# Patient Record
Sex: Female | Born: 1965 | Race: White | Hispanic: No | Marital: Married | Smoking: Never smoker
Health system: Southern US, Community
[De-identification: ages and names within clinical notes are randomized; demographics above are authoritative.]

## PROBLEM LIST (undated history)

## (undated) DIAGNOSIS — K5792 Diverticulitis of intestine, part unspecified, without perforation or abscess without bleeding: Secondary | ICD-10-CM

## (undated) DIAGNOSIS — M1990 Unspecified osteoarthritis, unspecified site: Secondary | ICD-10-CM

## (undated) HISTORY — PX: TUBAL LIGATION: SHX77

## (undated) HISTORY — PX: ABLATION: SHX5711

## (undated) HISTORY — PX: NASAL SINUS SURGERY: SHX719

## (undated) HISTORY — PX: SIGMOIDECTOMY: SHX176

---

## 2007-12-28 ENCOUNTER — Emergency Department (HOSPITAL_BASED_OUTPATIENT_CLINIC_OR_DEPARTMENT_OTHER): Admission: EM | Admit: 2007-12-28 | Discharge: 2007-12-28 | Payer: Self-pay | Admitting: Emergency Medicine

## 2019-08-29 ENCOUNTER — Other Ambulatory Visit: Payer: Self-pay

## 2019-08-29 ENCOUNTER — Emergency Department (HOSPITAL_BASED_OUTPATIENT_CLINIC_OR_DEPARTMENT_OTHER): Payer: Managed Care, Other (non HMO)

## 2019-08-29 ENCOUNTER — Emergency Department (HOSPITAL_BASED_OUTPATIENT_CLINIC_OR_DEPARTMENT_OTHER)
Admission: EM | Admit: 2019-08-29 | Discharge: 2019-08-29 | Disposition: A | Discharge status: Home / Self Care | Payer: Managed Care, Other (non HMO) | Attending: Emergency Medicine | Admitting: Emergency Medicine

## 2019-08-29 ENCOUNTER — Encounter (HOSPITAL_BASED_OUTPATIENT_CLINIC_OR_DEPARTMENT_OTHER): Payer: Self-pay | Admitting: *Deleted

## 2019-08-29 DIAGNOSIS — K5732 Diverticulitis of large intestine without perforation or abscess without bleeding: Secondary | ICD-10-CM | POA: Diagnosis not present

## 2019-08-29 DIAGNOSIS — R109 Unspecified abdominal pain: Secondary | ICD-10-CM | POA: Diagnosis present

## 2019-08-29 DIAGNOSIS — Z79899 Other long term (current) drug therapy: Secondary | ICD-10-CM | POA: Diagnosis not present

## 2019-08-29 DIAGNOSIS — K5792 Diverticulitis of intestine, part unspecified, without perforation or abscess without bleeding: Secondary | ICD-10-CM

## 2019-08-29 HISTORY — DX: Diverticulitis of intestine, part unspecified, without perforation or abscess without bleeding: K57.92

## 2019-08-29 HISTORY — DX: Unspecified osteoarthritis, unspecified site: M19.90

## 2019-08-29 LAB — URINALYSIS, ROUTINE W REFLEX MICROSCOPIC
Bilirubin Urine: NEGATIVE
Glucose, UA: NEGATIVE mg/dL
Hgb urine dipstick: NEGATIVE
Ketones, ur: NEGATIVE mg/dL
Leukocytes,Ua: NEGATIVE
Nitrite: NEGATIVE
Protein, ur: NEGATIVE mg/dL
Specific Gravity, Urine: 1.025 (ref 1.005–1.030)
pH: 5.5 (ref 5.0–8.0)

## 2019-08-29 LAB — CBC
HCT: 39.3 % (ref 36.0–46.0)
Hemoglobin: 13.2 g/dL (ref 12.0–15.0)
MCH: 29.2 pg (ref 26.0–34.0)
MCHC: 33.6 g/dL (ref 30.0–36.0)
MCV: 86.9 fL (ref 80.0–100.0)
Platelets: 250 10*3/uL (ref 150–400)
RBC: 4.52 MIL/uL (ref 3.87–5.11)
RDW: 12.4 % (ref 11.5–15.5)
WBC: 11.3 10*3/uL — ABNORMAL HIGH (ref 4.0–10.5)
nRBC: 0 % (ref 0.0–0.2)

## 2019-08-29 LAB — COMPREHENSIVE METABOLIC PANEL
ALT: 16 U/L (ref 0–44)
AST: 17 U/L (ref 15–41)
Albumin: 3.9 g/dL (ref 3.5–5.0)
Alkaline Phosphatase: 97 U/L (ref 38–126)
Anion gap: 11 (ref 5–15)
BUN: 13 mg/dL (ref 6–20)
CO2: 22 mmol/L (ref 22–32)
Calcium: 8.9 mg/dL (ref 8.9–10.3)
Chloride: 102 mmol/L (ref 98–111)
Creatinine, Ser: 0.57 mg/dL (ref 0.44–1.00)
GFR calc Af Amer: >60 mL/min (ref >60)
GFR calc non Af Amer: >60 mL/min (ref >60)
Glucose, Bld: 104 mg/dL — ABNORMAL HIGH (ref 70–99)
Potassium: 3.3 mmol/L — ABNORMAL LOW (ref 3.5–5.1)
Sodium: 135 mmol/L (ref 135–145)
Total Bilirubin: 0.6 mg/dL (ref 0.3–1.2)
Total Protein: 7.4 g/dL (ref 6.5–8.1)

## 2019-08-29 LAB — PREGNANCY, URINE: Preg Test, Ur: NEGATIVE

## 2019-08-29 LAB — LIPASE, BLOOD: Lipase: 21 U/L (ref 11–51)

## 2019-08-29 MED ORDER — AMOXICILLIN-POT CLAVULANATE 875-125 MG PO TABS: 1.0000 | ORAL_TABLET | Freq: Two times a day (BID) | ORAL | 0 refills | Status: AC

## 2019-08-29 MED ORDER — ONDANSETRON HCL 4 MG/2ML IJ SOLN
4.0000 mg | Freq: Once | INTRAMUSCULAR | Status: AC
  Administered 2019-08-29: 4 mg via INTRAVENOUS
  Filled 2019-08-29: qty 2

## 2019-08-29 MED ORDER — AMOXICILLIN-POT CLAVULANATE 875-125 MG PO TABS
1.0000 | ORAL_TABLET | Freq: Once | ORAL | Status: AC
  Administered 2019-08-29: 1 via ORAL
  Filled 2019-08-29: qty 1

## 2019-08-29 MED ORDER — HYDROCODONE-ACETAMINOPHEN 5-325 MG PO TABS: 2.0000 | ORAL_TABLET | ORAL | 0 refills | Status: AC | PRN

## 2019-08-29 MED ORDER — SODIUM CHLORIDE 0.9% FLUSH
3.0000 mL | Freq: Once | INTRAVENOUS | Status: DC
  Filled 2019-08-29: qty 3

## 2019-08-29 MED ORDER — SODIUM CHLORIDE 0.9 % IV BOLUS
1000.0000 mL | Freq: Once | INTRAVENOUS | Status: AC
  Administered 2019-08-29: 1000 mL via INTRAVENOUS

## 2019-08-29 MED ORDER — MORPHINE SULFATE (PF) 4 MG/ML IV SOLN
4.0000 mg | Freq: Once | INTRAVENOUS | Status: AC
  Administered 2019-08-29: 4 mg via INTRAVENOUS
  Filled 2019-08-29: qty 1

## 2019-08-29 NOTE — Discharge Instructions (Addendum)
Take Augmentin as prescribed and complete the full course unless instructed otherwise by your provider. Take Norco as needed as prescribed. Return to ER for fevers, uncontrolled pain, vomiting, worsening or concerning symptoms. Follow-up with your provider in the next 1 to 2 days.

## 2019-08-29 NOTE — ED Triage Notes (Signed)
Abdominal pain x 2 days. Hx of diverticulitis.  

## 2019-08-29 NOTE — ED Notes (Signed)
Attempted to place administer IV stick to Pt. X 2 with unsuccessful attempt.

## 2019-08-29 NOTE — ED Notes (Signed)
Pt. Reports she had some of her colon removed recently due to diverticulitis

## 2019-08-29 NOTE — ED Provider Notes (Signed)
[facility] EMERGENCY DEPARTMENT Provider Note   CSN: [id number] Arrival date & time: 08/29/19  1633     History Chief Complaint  Patient presents with  . Abdominal Pain    Emma Rodriguez is a 54 y.o. female.  54 year old female presents with complaint of abdominal pain with nausea, fever, diarrhea.  Patient states symptoms started 2 days ago, progressively worsening.  Patient had a temp of 100.3 earlier today, had not taken anything for her fever which improved upon arrival in the emergency room.  Patient was seen by her providers office and was told to come to the ER for CT scan.  Patient is 3 months post sigmoid colectomy for diverticulitis.  Pain is described as being kicked in the stomach, more so in the right lower quadrant, nothing makes pain better or worse.  No known sick contacts, no blood in stools, no vomiting.  No other complaints or concerns.        Past Medical History:  Diagnosis Date  . Arthritis   . Diverticulitis     There are no problems to display for this patient.   Past Surgical History:  Procedure Laterality Date  . ABLATION    . NASAL SINUS SURGERY    . SIGMOIDECTOMY    . TUBAL LIGATION       OB History   No obstetric history on file.     No family history on file.  Social History   Tobacco Use  . Smoking status: Never Smoker  . Smokeless tobacco: Never Used  Substance Use Topics  . Alcohol use: Never  . Drug use: Never    Home Medications Prior to Admission medications   Medication Sig Start Date End Date Taking? Authorizing Provider  Cholecalciferol 125 MCG (5000 UT) TABS Take by mouth.   Yes [provider]  dicyclomine (BENTYL) 20 MG tablet Take by mouth. 10/07/18  Yes [provider]  diphenhydrAMINE (SOMINEX) 25 MG tablet Take by mouth.   Yes [provider]  fluticasone (FLONASE) 50 MCG/ACT nasal spray Place into the nose.   Yes [provider]  inFLIXimab (REMICADE) 100 MG  injection Inject into the vein.   Yes [provider]  leucovorin (WELLCOVORIN) 5 MG tablet Take by mouth. 03/27/17  Yes [provider]  lisinopril (ZESTRIL) 20 MG tablet Take by mouth. 04/25/19  Yes [provider]  methotrexate (RHEUMATREX) 2.5 MG tablet Take by mouth.   Yes [provider]  omeprazole (PRILOSEC) 20 MG capsule Take by mouth.   Yes [provider]  ondansetron (ZOFRAN) 4 MG tablet Take by mouth. 11/15/17  Yes [provider]  pravastatin (PRAVACHOL) 20 MG tablet Take 1 tablet by mouth daily. 10/07/18  Yes [provider]  Probiotic CAPS Take by mouth.   Yes [provider]  sertraline (ZOLOFT) 100 MG tablet Take by mouth. 06/27/19  Yes [provider]  traZODone (DESYREL) 50 MG tablet TAKE ONE TABLET BY MOUTH ONCE NIGHTLY .  Patient must have office visit before next refill. 08/21/19  Yes [provider]  triamterene-hydrochlorothiazide (DYAZIDE) 37.5-25 MG capsule Take 1 capsule by mouth daily. 09/14/18  Yes [provider]  amoxicillin-clavulanate (AUGMENTIN) 875-125 MG tablet Take 1 tablet by mouth every 12 (twelve) hours. 08/29/19   Jeannie Fend, PA-C  HYDROcodone-acetaminophen (NORCO/VICODIN) 5-325 MG tablet Take 2 tablets by mouth every 4 (four) hours as needed. 08/29/19   Jeannie Fend, PA-C    Allergies    Patient  has no known allergies.  Review of Systems   Review of Systems  Constitutional: Positive for fever. Negative for diaphoresis.  Respiratory: Negative for shortness of breath.   Cardiovascular: Negative for chest pain.  Gastrointestinal: Positive for abdominal pain, diarrhea and nausea. Negative for vomiting.  Genitourinary: Negative for dysuria and frequency.  Musculoskeletal: Negative for back pain.  Skin: Negative for rash and wound.  Allergic/Immunologic: Negative for immunocompromised state.  Neurological: Negative for weakness.  Hematological: Negative  for adenopathy.  Psychiatric/Behavioral: Negative for confusion.  All other systems reviewed and are negative.   Physical Exam Updated Vital Signs BP 109/74   Pulse 88   Temp 98.8 F (37.1 C) (Oral)   Resp 18   Ht 5' (1.524 m)   Wt 77.1 kg   SpO2 97%   BMI 33.20 kg/m   Physical Exam Vitals and nursing note reviewed.  Constitutional:      General: She is not in acute distress.    Appearance: She is well-developed. She is not diaphoretic.  HENT:     Head: Normocephalic and atraumatic.  Cardiovascular:     Rate and Rhythm: Normal rate and regular rhythm.     Heart sounds: Normal heart sounds. No murmur heard.   Pulmonary:     Effort: Pulmonary effort is normal.     Breath sounds: Normal breath sounds.  Abdominal:     General: A surgical scar is present.     Tenderness: There is abdominal tenderness in the right lower quadrant. There is no right CVA tenderness or left CVA tenderness.  Skin:    General: Skin is warm and dry.     Findings: No erythema or rash.  Neurological:     Mental Status: She is alert and oriented to person, place, and time.  Psychiatric:        Behavior: Behavior normal.     ED Results / Procedures / Treatments   Labs (all labs ordered are listed, but only abnormal results are displayed) Labs Reviewed  COMPREHENSIVE METABOLIC PANEL - Abnormal; Notable for the following components:      Result Value   Potassium 3.3 (*)    Glucose, Bld 104 (*)    All other components within normal limits  CBC - Abnormal; Notable for the following components:   WBC 11.3 (*)    All other components within normal limits  LIPASE, BLOOD  URINALYSIS, ROUTINE W REFLEX MICROSCOPIC  PREGNANCY, URINE    EKG None  Radiology CT ABDOMEN PELVIS WO CONTRAST  Result Date: 08/29/2019 CLINICAL DATA:  Abdominal pain with fever post colectomy EXAM: CT ABDOMEN AND PELVIS WITHOUT CONTRAST TECHNIQUE: Multidetector CT imaging of the abdomen and pelvis was performed following  the standard protocol without IV contrast. COMPARISON:  November 02, 2018 FINDINGS: Lower chest: The visualized heart size within normal limits. No pericardial fluid/thickening. A small hiatal hernia present. The visualized portions of the lungs are clear. Hepatobiliary: Although limited due to the lack of intravenous contrast, normal in appearance without gross focal abnormality. No evidence of calcified gallstones or biliary ductal dilatation. Pancreas:  Unremarkable.  No surrounding inflammatory changes. Spleen: Normal in size. Although limited due to the lack of intravenous contrast, normal in appearance. Adrenals/Urinary Tract: Both adrenal glands appear normal. Again noted are bilateral parapelvic cysts. No renal or collecting system calculi are seen. No hydronephrosis is noted. Bladder is unremarkable. Stomach/Bowel: The stomach and small bowel are normal in appearance. There is been a prior sigmoid rectal junction anastomosis. Scattered colonic  diverticular with significant wall thickening and surrounding fat stranding changes are seen at the distal sigmoid colon near the anastomosis. No free air or loculated fluid collection are noted. Vascular/Lymphatic: There are no enlarged abdominal or pelvic lymph nodes. No significant gross vascular findings are present. Reproductive: The uterus and adnexa are unremarkable. Other: No evidence of abdominal wall mass or hernia. Musculoskeletal: No acute or significant osseous findings. IMPRESSION: Prior partial colectomy with findings of acute diverticulitis of the distal descending sigmoid colon near the anastomosis. No loculated fluid collections or free air. Electronically Signed   By: Jonna Clark M.D.   On: 08/29/2019 19:36    Procedures Procedures (including critical care time)  Medications Ordered in ED Medications  sodium chloride flush (NS) 0.9 % injection 3 mL (3 mLs Intravenous Not Given 08/29/19 1907)  amoxicillin-clavulanate (AUGMENTIN) 875-125 MG  per tablet 1 tablet (has no administration in time range)  sodium chloride 0.9 % bolus 1,000 mL (1,000 mLs Intravenous New Bag/Given 08/29/19 1908)  ondansetron (ZOFRAN) injection 4 mg (4 mg Intravenous Given 08/29/19 1908)  morphine 4 MG/ML injection 4 mg (4 mg Intravenous Given 08/29/19 1909)    ED Course  I have reviewed the triage vital signs and the nursing notes.  Pertinent labs & imaging results that were available during my care of the patient were reviewed by me and considered in my medical decision making (see chart for details).  Clinical Course as of Aug 28 2005  Tue Aug 29, 2019  697 54 year old female with diverticulitis with recent colectomy presents with abdominal pain with nausea and concern for recurrent diverticulitis.  On exam patient has tenderness in the right lower quadrant.  Patient is given IV fluids and morphine, symptoms have improved.  Review of lab work, mild leukocytosis with white count of 11.3, lipase within normal limits, CMP with mild hypokalemia of 3.3.  Urinalysis is unremarkable and urine pregnancy test is negative.  CT shows diverticulitis without further complications.  Reviewed results with patient and her spouse at bedside.  Patient states in the past she has always been treated with Cipro and Flagyl, has never tried Augmentin, has never been told of any other treatment options.  Discussed concerns for Cipro use, will start with Augmentin however with patient's complex diverticulitis history, recommend that she contact her providers office tomorrow to discuss results and see if they would like to change her antibiotics.  Patient is also given Norco for her pain.  Patient return to ER for worsening symptoms, persistent fever, other complaints or concerns otherwise plan to follow-up with PCP or other provider in her care team.   [LM]    Clinical Course User Index [LM] Alden Hipp   MDM Rules/Calculators/A&P                          Final Clinical  Impression(s) / ED Diagnoses Final diagnoses:  Diverticulitis    Rx / DC Orders ED Discharge Orders         Ordered    amoxicillin-clavulanate (AUGMENTIN) 875-125 MG tablet  Every 12 hours     Discontinue  Reprint     08/29/19 1959    HYDROcodone-acetaminophen (NORCO/VICODIN) 5-325 MG tablet  Every 4 hours PRN     Discontinue  Reprint     08/29/19 1959           Jeannie Fend, PA-C 08/29/19 2007    Geoffery Lyons, MD 08/30/19 1545

## 2021-09-23 IMAGING — CT CT ABD-PELV W/O CM
2 of 4 series · 16 of 46 positions shown, 18 images · non-contrast
Comparison: November 02, 2018

CLINICAL DATA: Abdominal pain with fever post colectomy

EXAM:
CT ABDOMEN AND PELVIS WITHOUT CONTRAST
TECHNIQUE: Multidetector CT imaging of the abdomen and pelvis was performed
following the standard protocol without IV contrast.

[Series 2: axial st · axial · 0.71mm/px · z∈[-578,-184]mm · 13 of 87 slices shown, 15 images]
[im 4/87  soft-tissue]
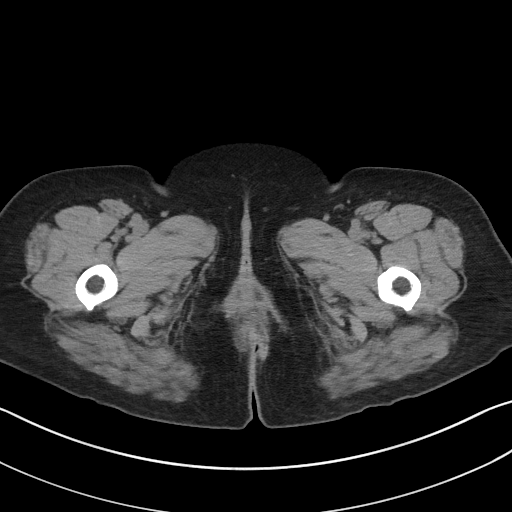
[im 4/87  bone]
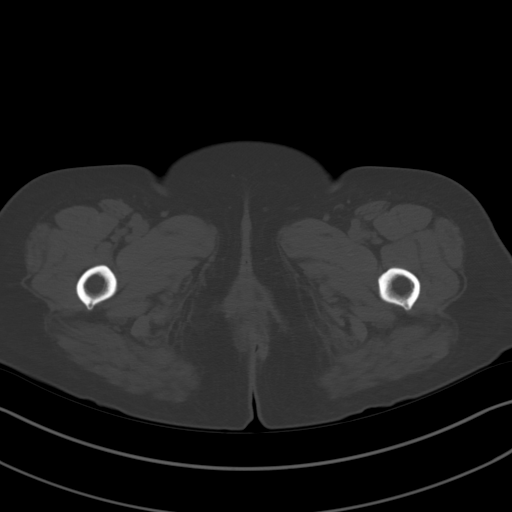
[im 11/87  soft-tissue]
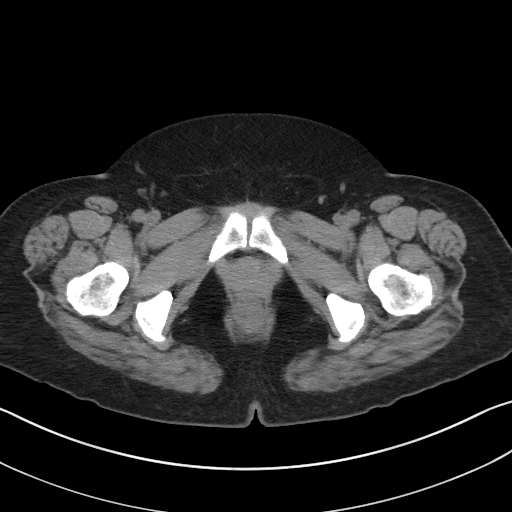
[im 18/87  soft-tissue]
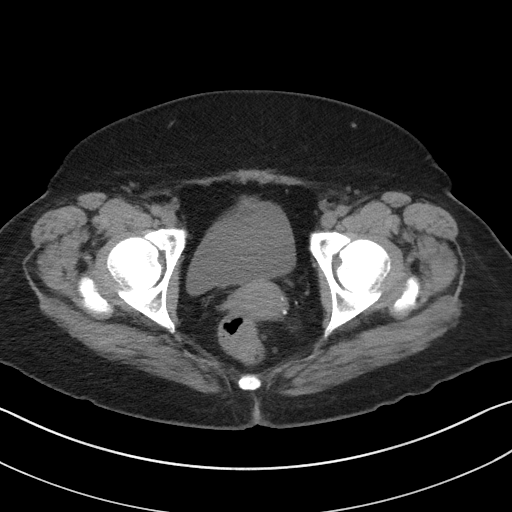
[im 25/87  soft-tissue]
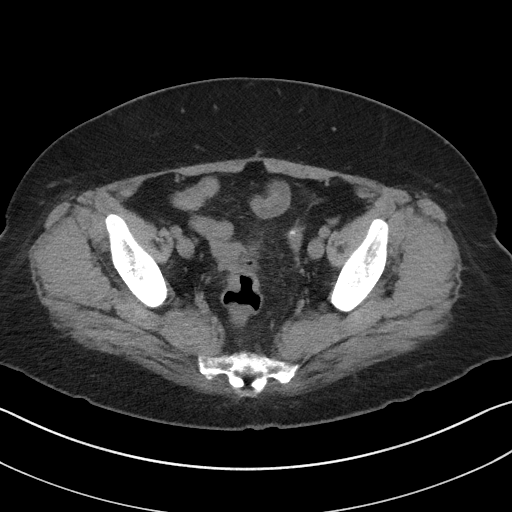
[im 31/87  soft-tissue]
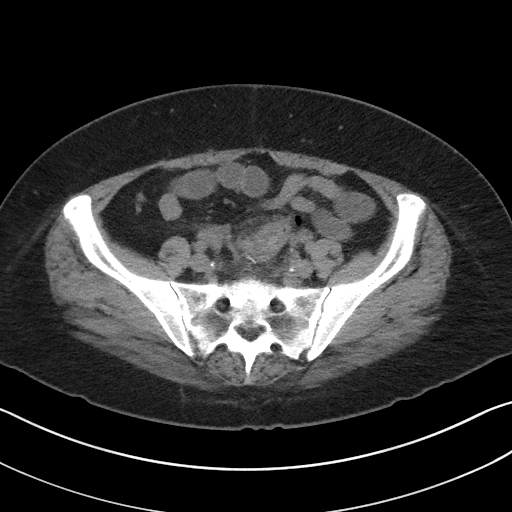
[im 38/87  soft-tissue]
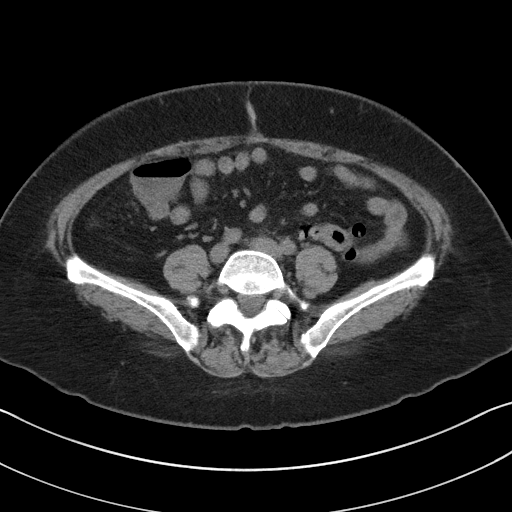
[im 45/87  soft-tissue]
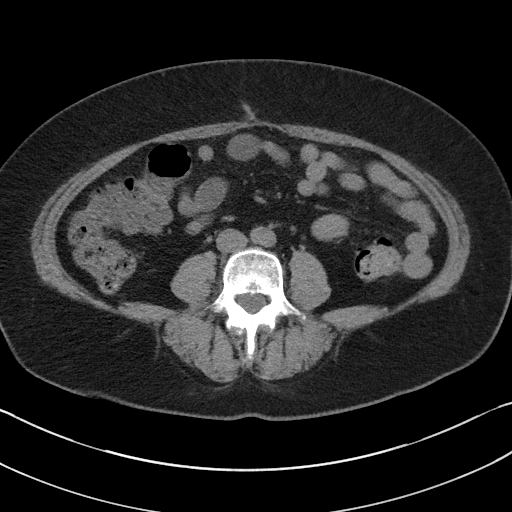
[im 49/87  soft-tissue]
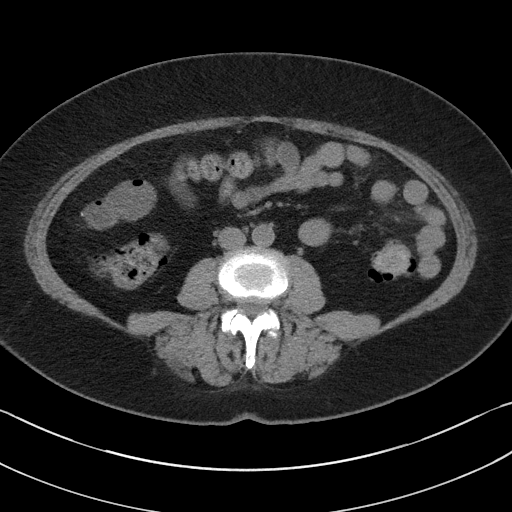
[im 56/87  soft-tissue]
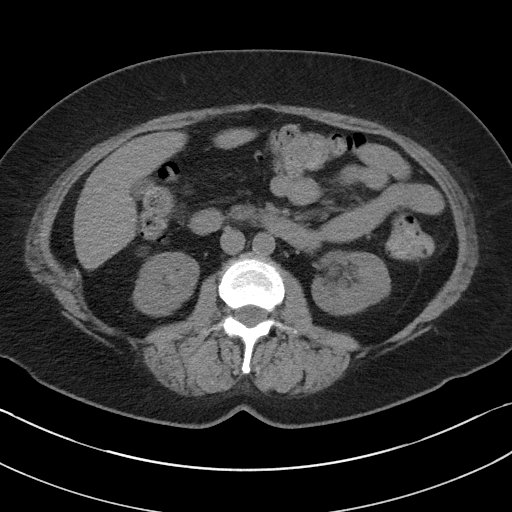
[im 56/87  bone]
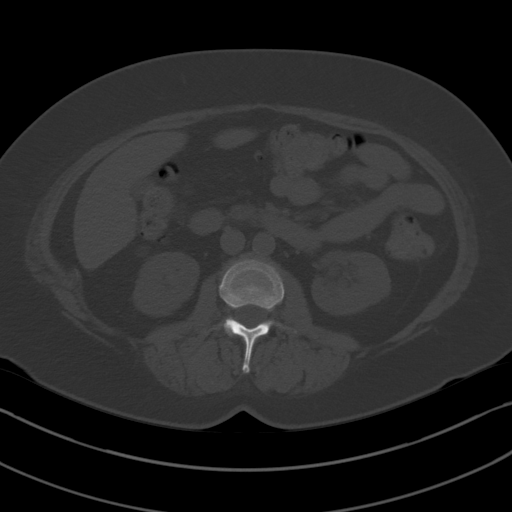
[im 62/87  soft-tissue]
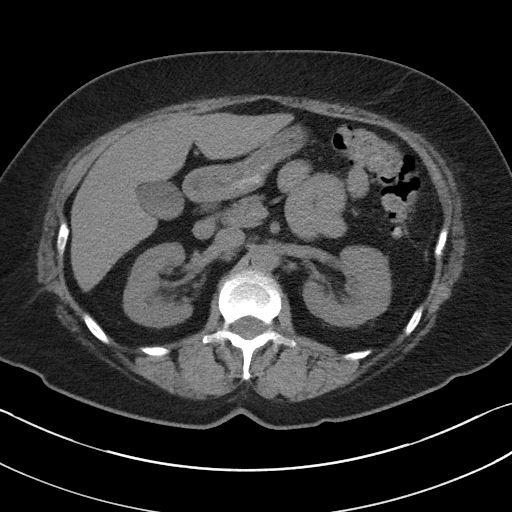
[im 69/87  soft-tissue]
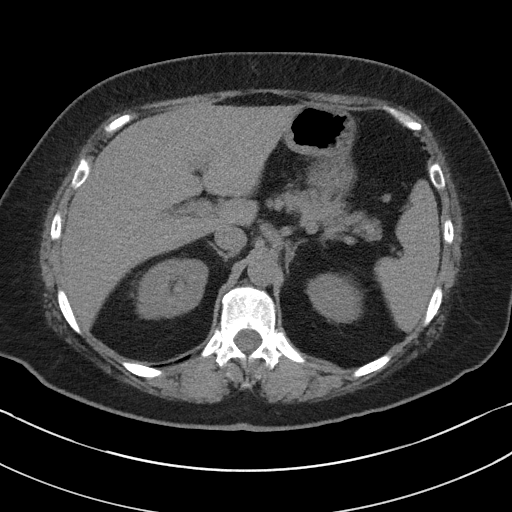
[im 76/87  soft-tissue]
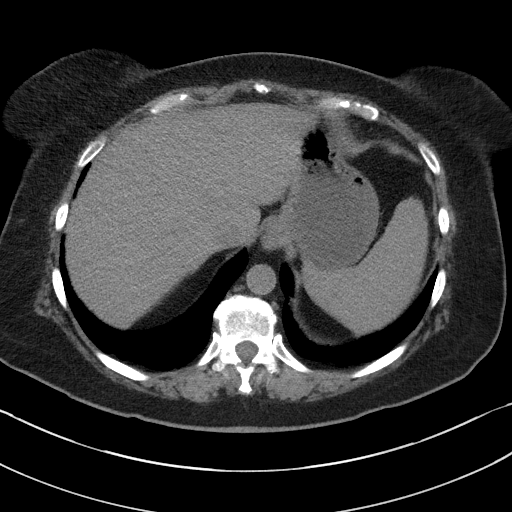
[im 83/87  soft-tissue]
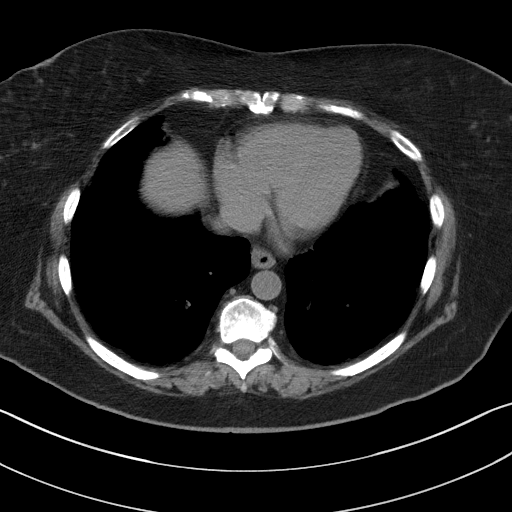

[Series 5: coronal st · coronal · 0.70mm/px · 3 of 101 slices shown]
[im 34/101  soft-tissue]
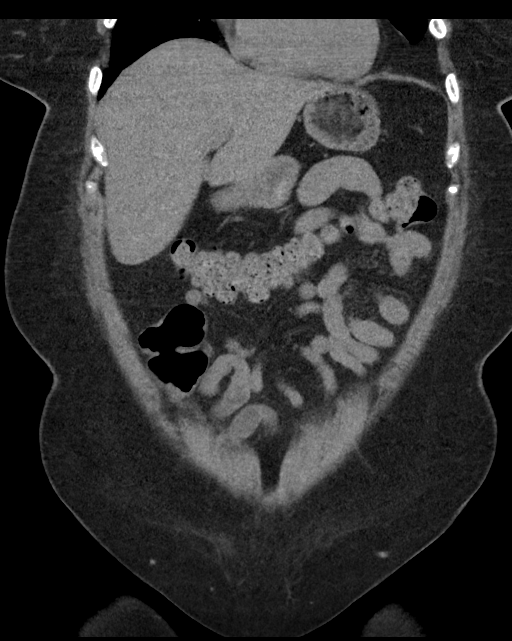
[im 45/101  soft-tissue]
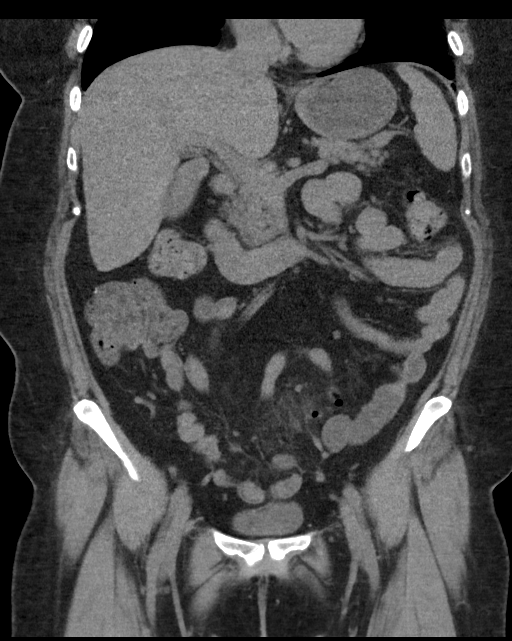
[im 56/101  soft-tissue]
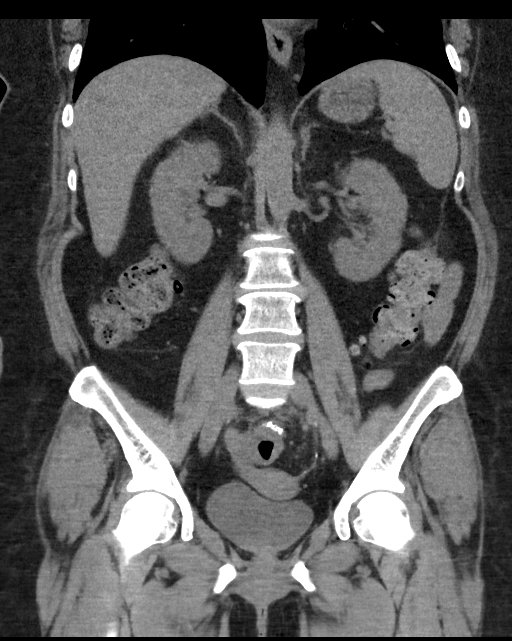

[16 of 46 positions shown; findings below may reference images not displayed]

FINDINGS: Lower chest: The visualized heart size within normal limits. No
pericardial fluid/thickening.

A small hiatal hernia present.

The visualized portions of the lungs are clear.

Hepatobiliary: Although limited due to the lack of intravenous
contrast, normal in appearance without gross focal abnormality. No
evidence of calcified gallstones or biliary ductal dilatation.

Pancreas:  Unremarkable.  No surrounding inflammatory changes.

Spleen: Normal in size. Although limited due to the lack of
intravenous contrast, normal in appearance.

Adrenals/Urinary Tract: Both adrenal glands appear normal. Again
noted are bilateral parapelvic cysts. No renal or collecting system
calculi are seen. No hydronephrosis is noted. Bladder is
unremarkable.

Stomach/Bowel: The stomach and small bowel are normal in appearance.
There is been a prior sigmoid rectal junction anastomosis. Scattered
colonic diverticular with significant wall thickening and
surrounding fat stranding changes are seen at the distal sigmoid
colon near the anastomosis. No free air or loculated fluid
collection are noted.

Vascular/Lymphatic: There are no enlarged abdominal or pelvic lymph
nodes. No significant gross vascular findings are present.

Reproductive: The uterus and adnexa are unremarkable.

Other: No evidence of abdominal wall mass or hernia.

Musculoskeletal: No acute or significant osseous findings.
IMPRESSION: Prior partial colectomy with findings of acute diverticulitis of the
distal descending sigmoid colon near the anastomosis. No loculated
fluid collections or free air.

## 2023-01-13 ENCOUNTER — Other Ambulatory Visit: Payer: Self-pay

## 2023-01-13 ENCOUNTER — Encounter (HOSPITAL_BASED_OUTPATIENT_CLINIC_OR_DEPARTMENT_OTHER): Payer: Self-pay | Admitting: Urology

## 2023-01-13 ENCOUNTER — Emergency Department (HOSPITAL_BASED_OUTPATIENT_CLINIC_OR_DEPARTMENT_OTHER)
Admission: EM | Admit: 2023-01-13 | Discharge: 2023-01-13 | Disposition: A | Discharge status: Home / Self Care | Payer: BC Managed Care – PPO | Attending: Emergency Medicine | Admitting: Emergency Medicine

## 2023-01-13 ENCOUNTER — Emergency Department (HOSPITAL_BASED_OUTPATIENT_CLINIC_OR_DEPARTMENT_OTHER): Payer: BC Managed Care – PPO

## 2023-01-13 DIAGNOSIS — E876 Hypokalemia: Secondary | ICD-10-CM | POA: Insufficient documentation

## 2023-01-13 DIAGNOSIS — R002 Palpitations: Secondary | ICD-10-CM | POA: Insufficient documentation

## 2023-01-13 LAB — MAGNESIUM: Magnesium: 2.1 mg/dL (ref 1.7–2.4)

## 2023-01-13 LAB — BASIC METABOLIC PANEL
Anion gap: 10 (ref 5–15)
BUN: 22 mg/dL — ABNORMAL HIGH (ref 6–20)
CO2: 22 mmol/L (ref 22–32)
Calcium: 9.2 mg/dL (ref 8.9–10.3)
Chloride: 105 mmol/L (ref 98–111)
Creatinine, Ser: 0.74 mg/dL (ref 0.44–1.00)
GFR, Estimated: >60 mL/min (ref >60)
Glucose, Bld: 126 mg/dL — ABNORMAL HIGH (ref 70–99)
Potassium: 2.9 mmol/L — ABNORMAL LOW (ref 3.5–5.1)
Sodium: 137 mmol/L (ref 135–145)

## 2023-01-13 LAB — TROPONIN I (HIGH SENSITIVITY)
Troponin I (High Sensitivity): <2 ng/L (ref <18)
Troponin I (High Sensitivity): <2 ng/L (ref <18)

## 2023-01-13 LAB — CBC
HCT: 41.2 % (ref 36.0–46.0)
Hemoglobin: 14.1 g/dL (ref 12.0–15.0)
MCH: 29.5 pg (ref 26.0–34.0)
MCHC: 34.2 g/dL (ref 30.0–36.0)
MCV: 86.2 fL (ref 80.0–100.0)
Platelets: 299 10*3/uL (ref 150–400)
RBC: 4.78 MIL/uL (ref 3.87–5.11)
RDW: 13.4 % (ref 11.5–15.5)
WBC: 10.1 10*3/uL (ref 4.0–10.5)
nRBC: 0 % (ref 0.0–0.2)

## 2023-01-13 LAB — PREGNANCY, URINE: Preg Test, Ur: NEGATIVE

## 2023-01-13 MED ORDER — POTASSIUM CHLORIDE CRYS ER 20 MEQ PO TBCR
40.0000 meq | EXTENDED_RELEASE_TABLET | Freq: Once | ORAL | Status: AC
  Administered 2023-01-13: 40 meq via ORAL
  Filled 2023-01-13: qty 2

## 2023-01-13 NOTE — ED Provider Notes (Signed)
[facility] [facility] AT [facility] Provider Note   CSN: [id number] Arrival date & time: 01/13/23  1659     History  Chief Complaint  Patient presents with   Palpitations    Emma Rodriguez is a 57 y.o. female with no pertinent past medical history presented for palpitations.  Patient is recently getting over pneumonia and has not been eating much and went to the urgent care but was brought here as she was told that she was having palpitations and needed to be seen here.  Patient denies any cardiac history, shortness of breath, fevers, thyroid history.  Patient denies any heart abnormalities or pacemakers.  Home Medications Prior to Admission medications   Medication Sig Start Date End Date Taking? Authorizing Provider  amoxicillin-clavulanate (AUGMENTIN) 875-125 MG tablet Take 1 tablet by mouth every 12 (twelve) hours. 08/29/19   Jeannie Fend, PA-C  Cholecalciferol 125 MCG (5000 UT) TABS Take by mouth.    [provider]  dicyclomine (BENTYL) 20 MG tablet Take by mouth. 10/07/18   [provider]  diphenhydrAMINE (SOMINEX) 25 MG tablet Take by mouth.    [provider]  fluticasone (FLONASE) 50 MCG/ACT nasal spray Place into the nose.    [provider]  HYDROcodone-acetaminophen (NORCO/VICODIN) 5-325 MG tablet Take 2 tablets by mouth every 4 (four) hours as needed. 08/29/19   Jeannie Fend, PA-C  inFLIXimab (REMICADE) 100 MG injection Inject into the vein.    [provider]  leucovorin (WELLCOVORIN) 5 MG tablet Take by mouth. 03/27/17   [provider]  lisinopril (ZESTRIL) 20 MG tablet Take by mouth. 04/25/19   [provider]  methotrexate (RHEUMATREX) 2.5 MG tablet Take by mouth.    [provider]  omeprazole (PRILOSEC) 20 MG capsule Take by mouth.    [provider]  ondansetron (ZOFRAN) 4 MG tablet Take by mouth. 11/15/17   [provider]  pravastatin (PRAVACHOL) 20  MG tablet Take 1 tablet by mouth daily. 10/07/18   [provider]  Probiotic CAPS Take by mouth.    [provider]  sertraline (ZOLOFT) 100 MG tablet Take by mouth. 06/27/19   [provider]  traZODone (DESYREL) 50 MG tablet TAKE ONE TABLET BY MOUTH ONCE NIGHTLY .  Patient must have office visit before next refill. 08/21/19   [provider]  triamterene-hydrochlorothiazide (DYAZIDE) 37.5-25 MG capsule Take 1 capsule by mouth daily. 09/14/18   [provider]      Allergies    Codeine and Tetanus toxoids    Review of Systems   Review of Systems  Cardiovascular:  Positive for palpitations.    Physical Exam Updated Vital Signs BP 139/88   Pulse 81   Temp (!) 97.4 F (36.3 C) (Oral)   Resp 16   Ht 5' (1.524 m)   Wt 77.1 kg   SpO2 96%   BMI 33.20 kg/m  Physical Exam Vitals reviewed.  Constitutional:      General: She is not in acute distress. HENT:     Head: Normocephalic and atraumatic.  Eyes:     Extraocular Movements: Extraocular movements intact.     Conjunctiva/sclera: Conjunctivae normal.     Pupils: Pupils are equal, round, and reactive to light.  Neck:     Comments: No goiter Cardiovascular:     Rate and Rhythm: Normal rate and regular rhythm.     Pulses: Normal pulses.     Heart sounds: Normal heart sounds.  Comments: 2+ bilateral radial/dorsalis pedis pulses with regular rate Pulmonary:     Effort: Pulmonary effort is normal. No respiratory distress.     Breath sounds: Normal breath sounds.  Abdominal:     Palpations: Abdomen is soft.     Tenderness: There is no abdominal tenderness. There is no guarding or rebound.  Musculoskeletal:        General: Normal range of motion.     Cervical back: Normal range of motion and neck supple.     Comments: 5 out of 5 bilateral grip/leg extension strength  Skin:    General: Skin is warm and dry.     Capillary Refill: Capillary refill takes less than 2 seconds.   Neurological:     General: No focal deficit present.     Mental Status: She is alert and oriented to person, place, and time.     Comments: Sensation intact in all 4 limbs  Psychiatric:        Mood and Affect: Mood normal.     ED Results / Procedures / Treatments   Labs (all labs ordered are listed, but only abnormal results are displayed) Labs Reviewed  BASIC METABOLIC PANEL - Abnormal; Notable for the following components:      Result Value   Potassium 2.9 (*)    Glucose, Bld 126 (*)    BUN 22 (*)    All other components within normal limits  CBC  PREGNANCY, URINE  MAGNESIUM  TROPONIN I (HIGH SENSITIVITY)  TROPONIN I (HIGH SENSITIVITY)    EKG EKG Interpretation Date/Time:  Wednesday January 13 2023 17:07:04 EST Ventricular Rate:  99 PR Interval:  135 QRS Duration:  106 QT Interval:  335 QTC Calculation: 430 R Axis:   44  Text Interpretation: Sinus rhythm Low voltage, precordial leads Confirmed by Tanda Rockers ([id number]) on 01/13/2023 7:56:33 PM  Radiology DG Chest 2 View  Result Date: 01/13/2023 CLINICAL DATA:  Chest pain, palpitations, recent pneumonia EXAM: CHEST - 2 VIEW COMPARISON:  01/01/2023 FINDINGS: Lungs are clear.  No pleural effusion or pneumothorax. The heart is normal in size. Mild degenerative changes of the visualized thoracolumbar spine. IMPRESSION: Normal chest radiographs. Electronically Signed   By: Charline Bills M.D.   On: 01/13/2023 18:19    Procedures Procedures    Medications Ordered in ED Medications  potassium chloride SA (KLOR-CON M) CR tablet 40 mEq (40 mEq Oral Given 01/13/23 1942)    ED Course/ Medical Decision Making/ A&P                                 Medical Decision Making Amount and/or Complexity of Data Reviewed Labs: ordered. Radiology: ordered.  Risk Prescription drug management.   Emma Rodriguez 57 y.o. presented today for palpitations.  Working DDx that I considered at this time includes, but not limited to,  electrolyte abnormalities, anxiety, caffeine/alcohol, drug-induced, arrhythmias, ACS, CHF, pacemaker problem, hypothyroidism, sepsis, feels chromocytoma, anemia, PE, dehydration, anxiety, panic attack.  R/o DDx: anxiety, caffeine/alcohol, drug-induced, arrhythmias, ACS, CHF, pacemaker problem, hypothyroidism, sepsis, feels chromocytoma, anemia, PE, dehydration, anxiety, panic attack: These are considered less likely due to history of present illness, physical exam, labs/imaging findings  Review of prior external notes: 01/13/2023 office visit  Unique Tests and My Interpretation:  CBC: Unremarkable EKG: Sinus 90 bpm, no ST elevations or depressions noted, no PVCs or blocks noted BMP: Hypokalemia 2.9 Mag: Unremarkable Chest x-ray: No acute findings Troponin: Less  than 2, less than 2 UPT: Negative  Social Determinants of Health: none  Discussion with Independent Historian:  Husband  Discussion of Management of Tests: None  Risk: Medium: prescription drug management  Risk Stratification Score: None  Plan: On exam patient was in no acute distress with stable vitals.  Patient presents alleged unremarkable however labs from triage do show potassium of 2.9 which could be contributing to the palpitations.  Patient was given potassium pills here.  I did offer to prescribe patient potassium pills however patient is on a potassium sparing diuretic and after shared decision making patient said that she would rather eat a diet rich in potassium for the next 2 days versus having potassium pills prescribed to her.  With patient's labs being reassuring besides a potassium patient will be discharged with primary care follow-up and encouraged to monitor symptoms.  Patient was given return precautions. Patient stable for discharge at this time.  Patient verbalized understanding of plan.  This chart was dictated using voice recognition software.  Despite best efforts to proofread,  errors can occur which  can change the documentation meaning.          Final Clinical Impression(s) / ED Diagnoses Final diagnoses:  Palpitations  Hypokalemia    Rx / DC Orders ED Discharge Orders     None         Remi Deter 01/13/23 2136    Sloan Leiter, DO 01/16/23 [phone]

## 2023-01-13 NOTE — ED Notes (Signed)
Patient transported to X-ray 

## 2023-01-13 NOTE — ED Triage Notes (Signed)
Pt states sent from [facility] due to palpitations  Did not do EKG just listened to it    Recent pneumonia dx New injection for RA

## 2023-01-13 NOTE — Discharge Instructions (Signed)
Please follow-up with your primary care provider regards recent ER visit.  Today your labs and imaging show your potassium is low which could be causing your palpitations and so as we agreed please eat a diet rich in potassium for the next few days along with a banana daily.  If symptoms change or worsen please return to the ER.
